# Patient Record
Sex: Female | Born: 1940 | Race: White | Hispanic: No | Smoking: Never smoker
Health system: Southern US, Community
[De-identification: ages and names within clinical notes are randomized; demographics above are authoritative.]

## PROBLEM LIST (undated history)

## (undated) DIAGNOSIS — I69359 Hemiplegia and hemiparesis following cerebral infarction affecting unspecified side: Secondary | ICD-10-CM

## (undated) DIAGNOSIS — F0390 Unspecified dementia without behavioral disturbance: Secondary | ICD-10-CM

## (undated) DIAGNOSIS — I10 Essential (primary) hypertension: Secondary | ICD-10-CM

## (undated) DIAGNOSIS — N289 Disorder of kidney and ureter, unspecified: Secondary | ICD-10-CM

## (undated) DIAGNOSIS — R451 Restlessness and agitation: Secondary | ICD-10-CM

---

## 1998-11-30 ENCOUNTER — Other Ambulatory Visit: Admission: RE | Admit: 1998-11-30 | Discharge: 1998-11-30 | Payer: Self-pay | Admitting: Obstetrics and Gynecology

## 1999-02-12 ENCOUNTER — Encounter: Admission: RE | Admit: 1999-02-12 | Discharge: 1999-05-13 | Payer: Self-pay | Admitting: Otolaryngology

## 1999-12-20 ENCOUNTER — Other Ambulatory Visit: Admission: RE | Admit: 1999-12-20 | Discharge: 1999-12-20 | Payer: Self-pay | Admitting: *Deleted

## 1999-12-25 ENCOUNTER — Ambulatory Visit (HOSPITAL_BASED_OUTPATIENT_CLINIC_OR_DEPARTMENT_OTHER): Admission: RE | Admit: 1999-12-25 | Discharge: 1999-12-25 | Payer: Self-pay | Admitting: Orthopaedic Surgery

## 2000-04-17 ENCOUNTER — Encounter (INDEPENDENT_AMBULATORY_CARE_PROVIDER_SITE_OTHER): Payer: Self-pay | Admitting: Specialist

## 2000-04-17 ENCOUNTER — Inpatient Hospital Stay (HOSPITAL_COMMUNITY): Admission: RE | Admit: 2000-04-17 | Discharge: 2000-04-18 | Payer: Self-pay | Admitting: Obstetrics and Gynecology

## 2000-07-17 ENCOUNTER — Ambulatory Visit (HOSPITAL_COMMUNITY): Admission: RE | Admit: 2000-07-17 | Discharge: 2000-07-17 | Payer: Self-pay | Admitting: Gastroenterology

## 2000-07-17 ENCOUNTER — Encounter (INDEPENDENT_AMBULATORY_CARE_PROVIDER_SITE_OTHER): Payer: Self-pay

## 2000-08-22 ENCOUNTER — Encounter: Payer: Self-pay | Admitting: Obstetrics and Gynecology

## 2000-08-22 ENCOUNTER — Encounter: Admission: RE | Admit: 2000-08-22 | Discharge: 2000-08-22 | Payer: Self-pay | Admitting: Obstetrics and Gynecology

## 2002-05-11 ENCOUNTER — Ambulatory Visit (HOSPITAL_BASED_OUTPATIENT_CLINIC_OR_DEPARTMENT_OTHER): Admission: RE | Admit: 2002-05-11 | Discharge: 2002-05-11 | Payer: Self-pay | Admitting: Orthopaedic Surgery

## 2004-09-27 ENCOUNTER — Emergency Department (HOSPITAL_COMMUNITY): Admission: EM | Admit: 2004-09-27 | Discharge: 2004-09-28 | Payer: Self-pay | Admitting: Emergency Medicine

## 2006-01-31 ENCOUNTER — Emergency Department (HOSPITAL_COMMUNITY): Admission: EM | Admit: 2006-01-31 | Discharge: 2006-01-31 | Payer: Self-pay | Admitting: Emergency Medicine

## 2006-07-08 ENCOUNTER — Ambulatory Visit (HOSPITAL_COMMUNITY): Admission: RE | Admit: 2006-07-08 | Discharge: 2006-07-08 | Payer: Self-pay | Admitting: Gastroenterology

## 2006-07-08 ENCOUNTER — Encounter (INDEPENDENT_AMBULATORY_CARE_PROVIDER_SITE_OTHER): Payer: Self-pay | Admitting: Specialist

## 2006-11-14 ENCOUNTER — Encounter: Admission: RE | Admit: 2006-11-14 | Discharge: 2006-11-14 | Payer: Self-pay | Admitting: Gastroenterology

## 2006-12-08 ENCOUNTER — Encounter: Admission: RE | Admit: 2006-12-08 | Discharge: 2006-12-08 | Payer: Self-pay | Admitting: Gastroenterology

## 2007-12-30 IMAGING — CT CT HEAD W/O CM
1 series · 16 of 30 positions shown, 20 images · IV contrast (agent unspecified)
Comparison: No comparison films available.

CLINICAL DATA: Altered mental status.  Confusion.  Hypertension.
HEAD CT WITHOUT CONTRAST:
TECHNIQUE: Contiguous axial images were obtained from the base of the skull through the vertex according to standard protocol without contrast.

[Series 2: brain · axial · 0.47mm/px · z∈[+145,+290]mm · 16 of 30 slices shown, 20 images]
[im 2/30  brain]
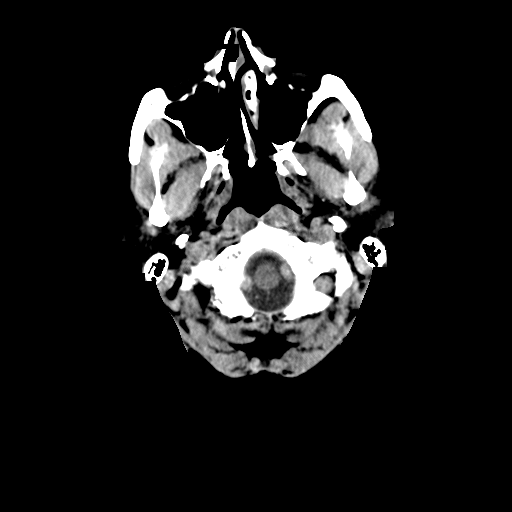
[im 2/30  bone]
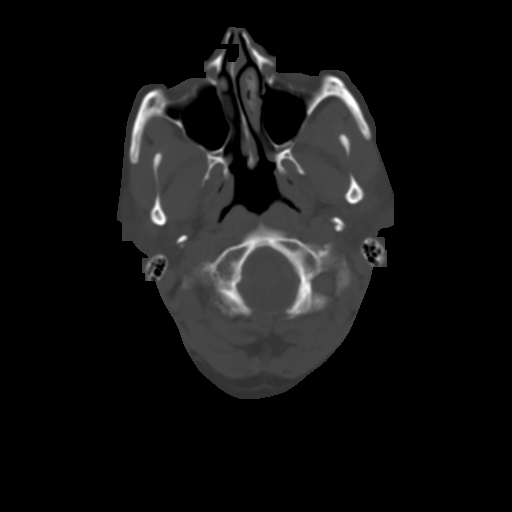
[im 4/30  brain]
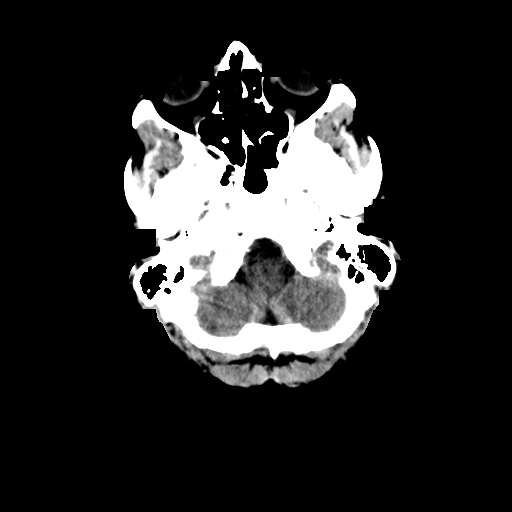
[im 6/30  brain]
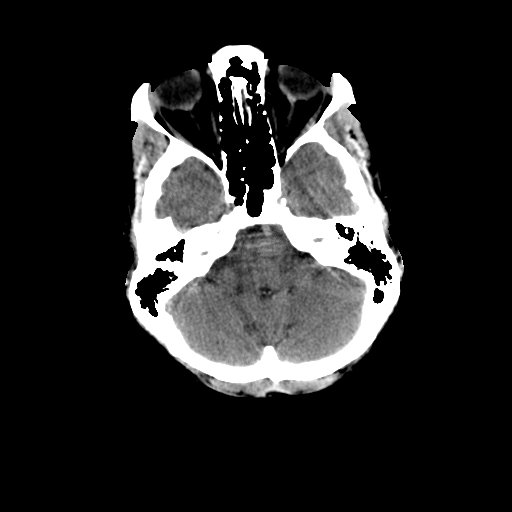
[im 8/30  brain]
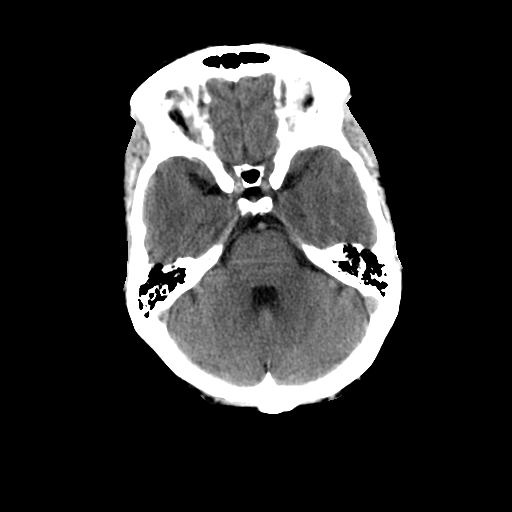
[im 9/30  brain]
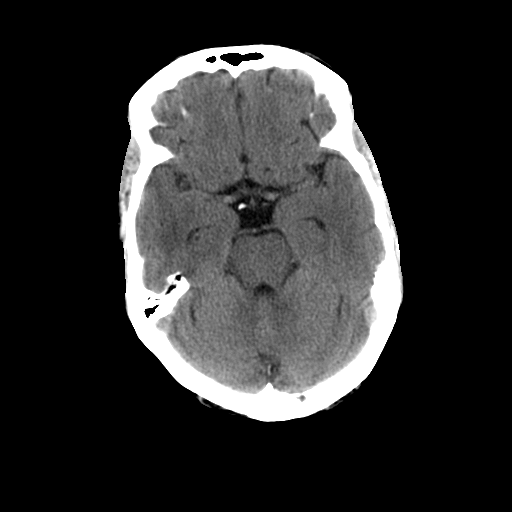
[im 9/30  bone]
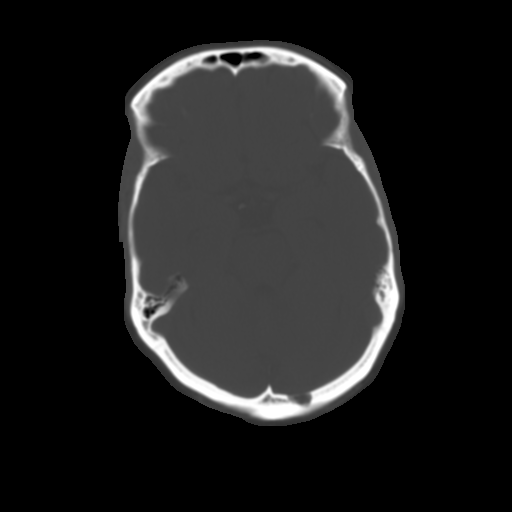
[im 11/30  brain]
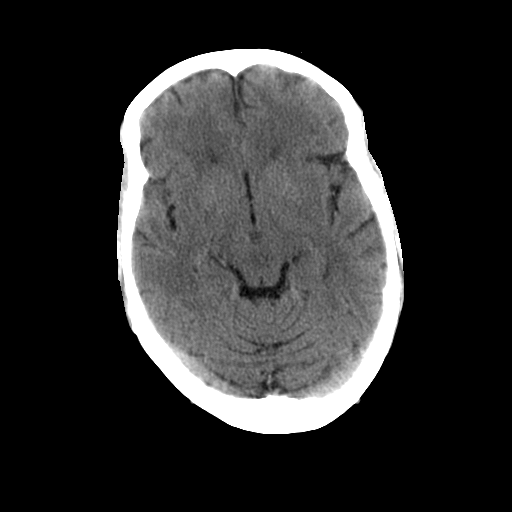
[im 13/30  brain]
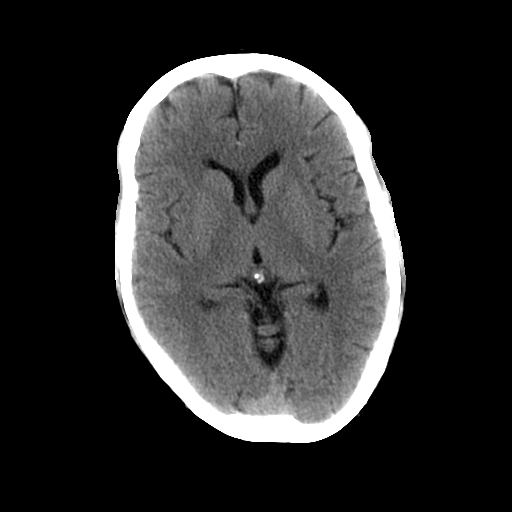
[im 15/30  brain]
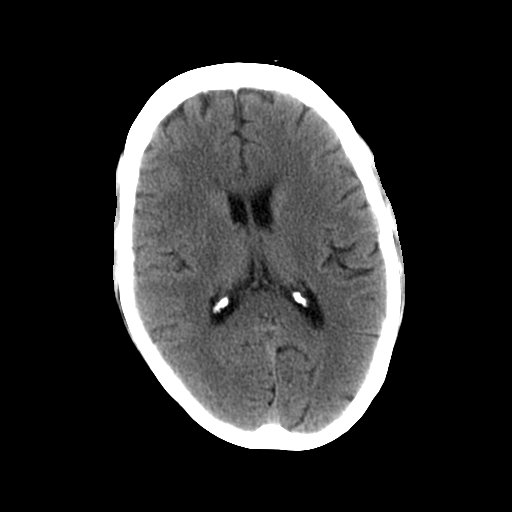
[im 16/30  brain]
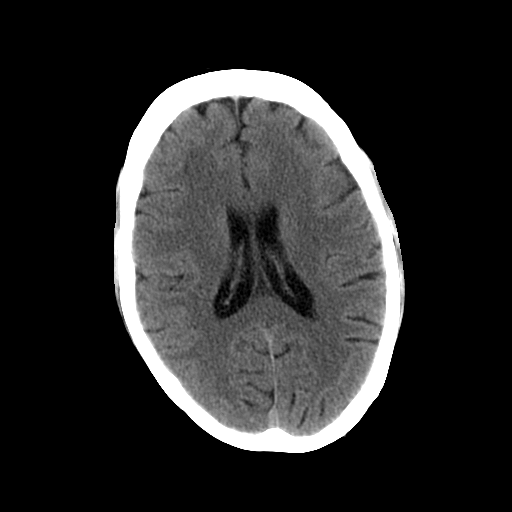
[im 16/30  bone]
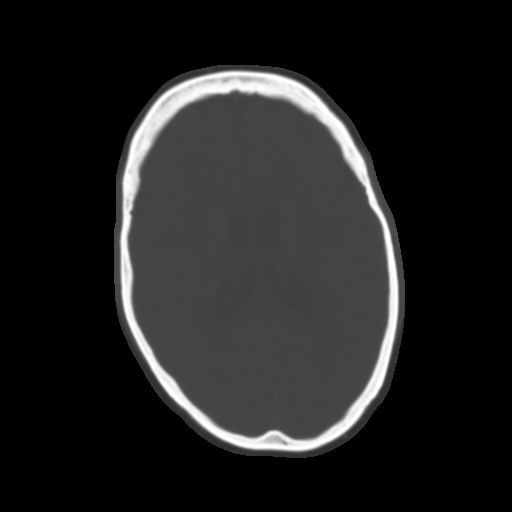
[im 18/30  brain]
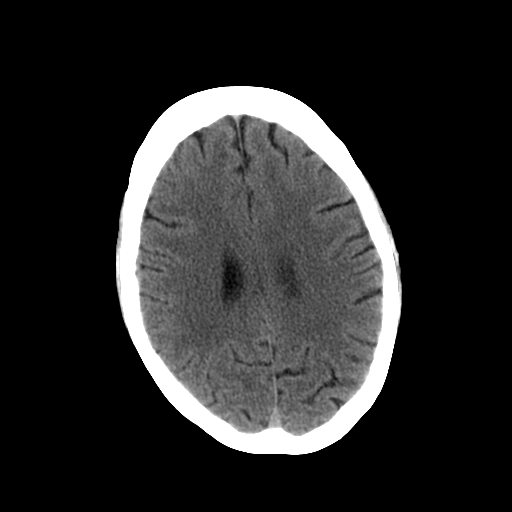
[im 20/30  brain]
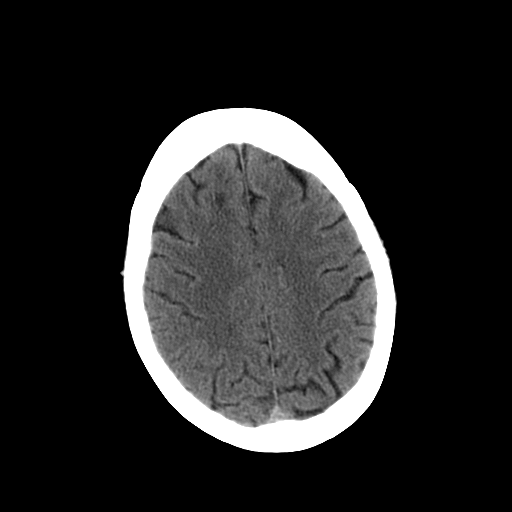
[im 22/30  brain]
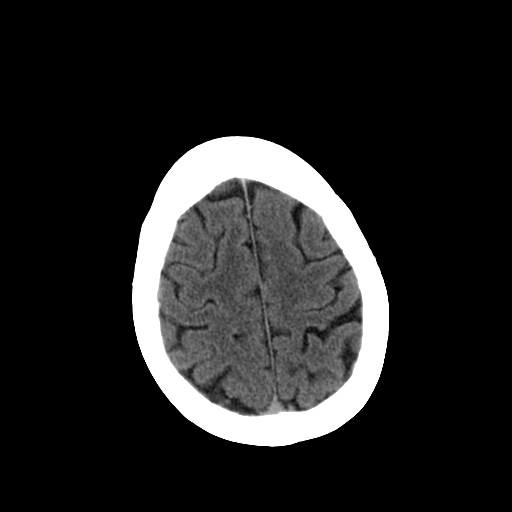
[im 23/30  brain]
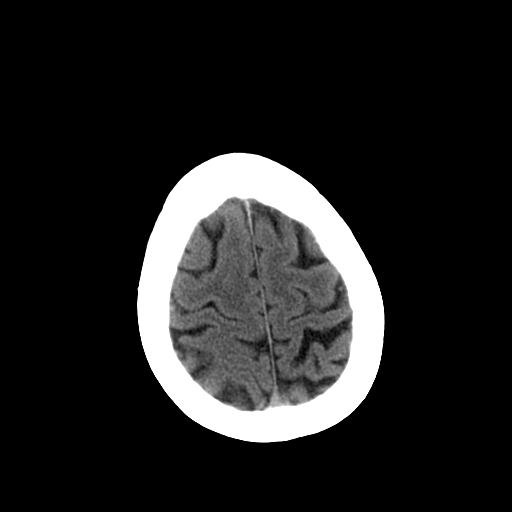
[im 23/30  bone]
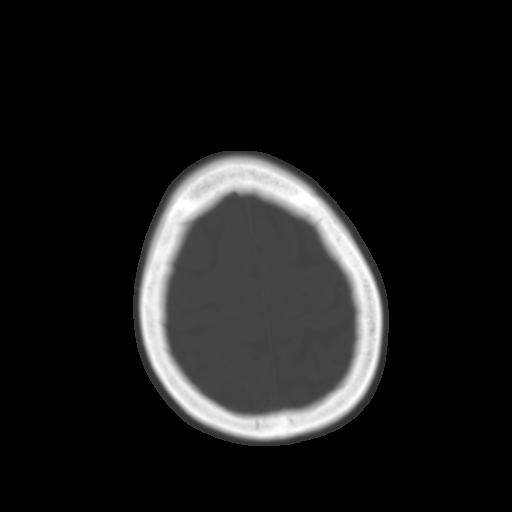
[im 25/30  brain]
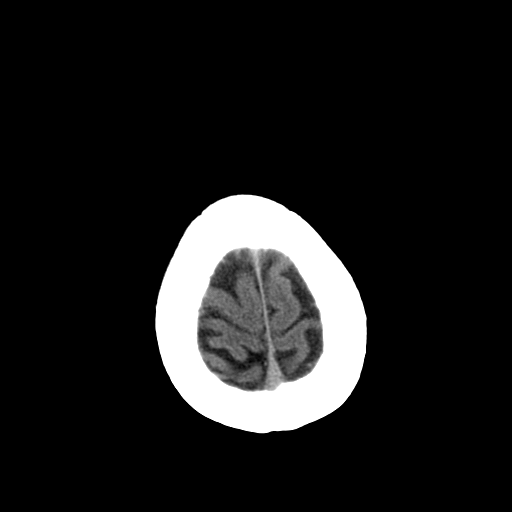
[im 27/30  brain]
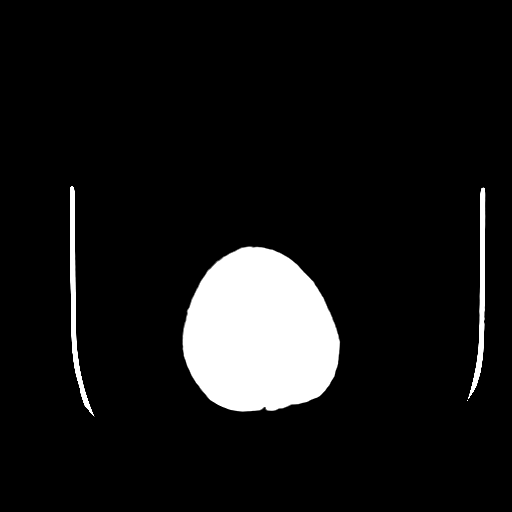
[im 29/30  brain]
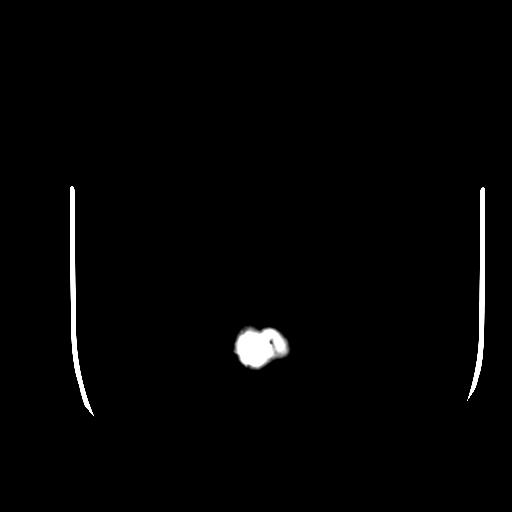

[16 of 30 positions shown; findings below may reference images not displayed]

FINDINGS: There is no evidence of acute intracranial abnormality including mass or mass effect, hydrocephalus, extraaxial fluid collection, midline shift, hemorrhage, or infarct.  Acute infarct may be missed by CT for 24 to 48 hours.  Nonspecific white matter hypodensities within the right frontal lobe identified.  The visualized bony calvarium and paranasal sinuses are unremarkable.
IMPRESSION: 1.  No evidence of acute intracranial abnormality.
2.  Nonspecific white matter disease - consider MR follow up.

## 2008-07-06 ENCOUNTER — Emergency Department (HOSPITAL_COMMUNITY): Admission: EM | Admit: 2008-07-06 | Discharge: 2008-07-06 | Payer: Self-pay | Admitting: Emergency Medicine

## 2008-09-25 ENCOUNTER — Inpatient Hospital Stay (HOSPITAL_COMMUNITY): Admission: EM | Admit: 2008-09-25 | Discharge: 2008-09-26 | Payer: Self-pay | Admitting: Emergency Medicine

## 2009-03-08 ENCOUNTER — Encounter: Admission: RE | Admit: 2009-03-08 | Discharge: 2009-06-06 | Payer: Self-pay | Admitting: Neurology

## 2009-05-04 ENCOUNTER — Emergency Department (HOSPITAL_COMMUNITY): Admission: EM | Admit: 2009-05-04 | Discharge: 2009-05-05 | Payer: Self-pay | Admitting: Emergency Medicine

## 2009-05-06 ENCOUNTER — Emergency Department (HOSPITAL_COMMUNITY): Admission: EM | Admit: 2009-05-06 | Discharge: 2009-05-06 | Payer: Self-pay | Admitting: Family Medicine

## 2009-07-06 ENCOUNTER — Inpatient Hospital Stay (HOSPITAL_COMMUNITY): Admission: EM | Admit: 2009-07-06 | Discharge: 2009-07-10 | Payer: Self-pay | Admitting: Emergency Medicine

## 2010-08-06 ENCOUNTER — Emergency Department (HOSPITAL_COMMUNITY): Admission: EM | Admit: 2010-08-06 | Discharge: 2010-08-06 | Payer: Self-pay | Admitting: Emergency Medicine

## 2010-08-25 IMAGING — CR DG ABDOMEN ACUTE W/ 1V CHEST
3 series · 3 of 3 positions shown · non-contrast
Comparison: 09/25/2008

CLINICAL DATA: Pain

ACUTE ABDOMEN SERIES (ABDOMEN 2 VIEW & CHEST 1 VIEW)

[w chest pa]
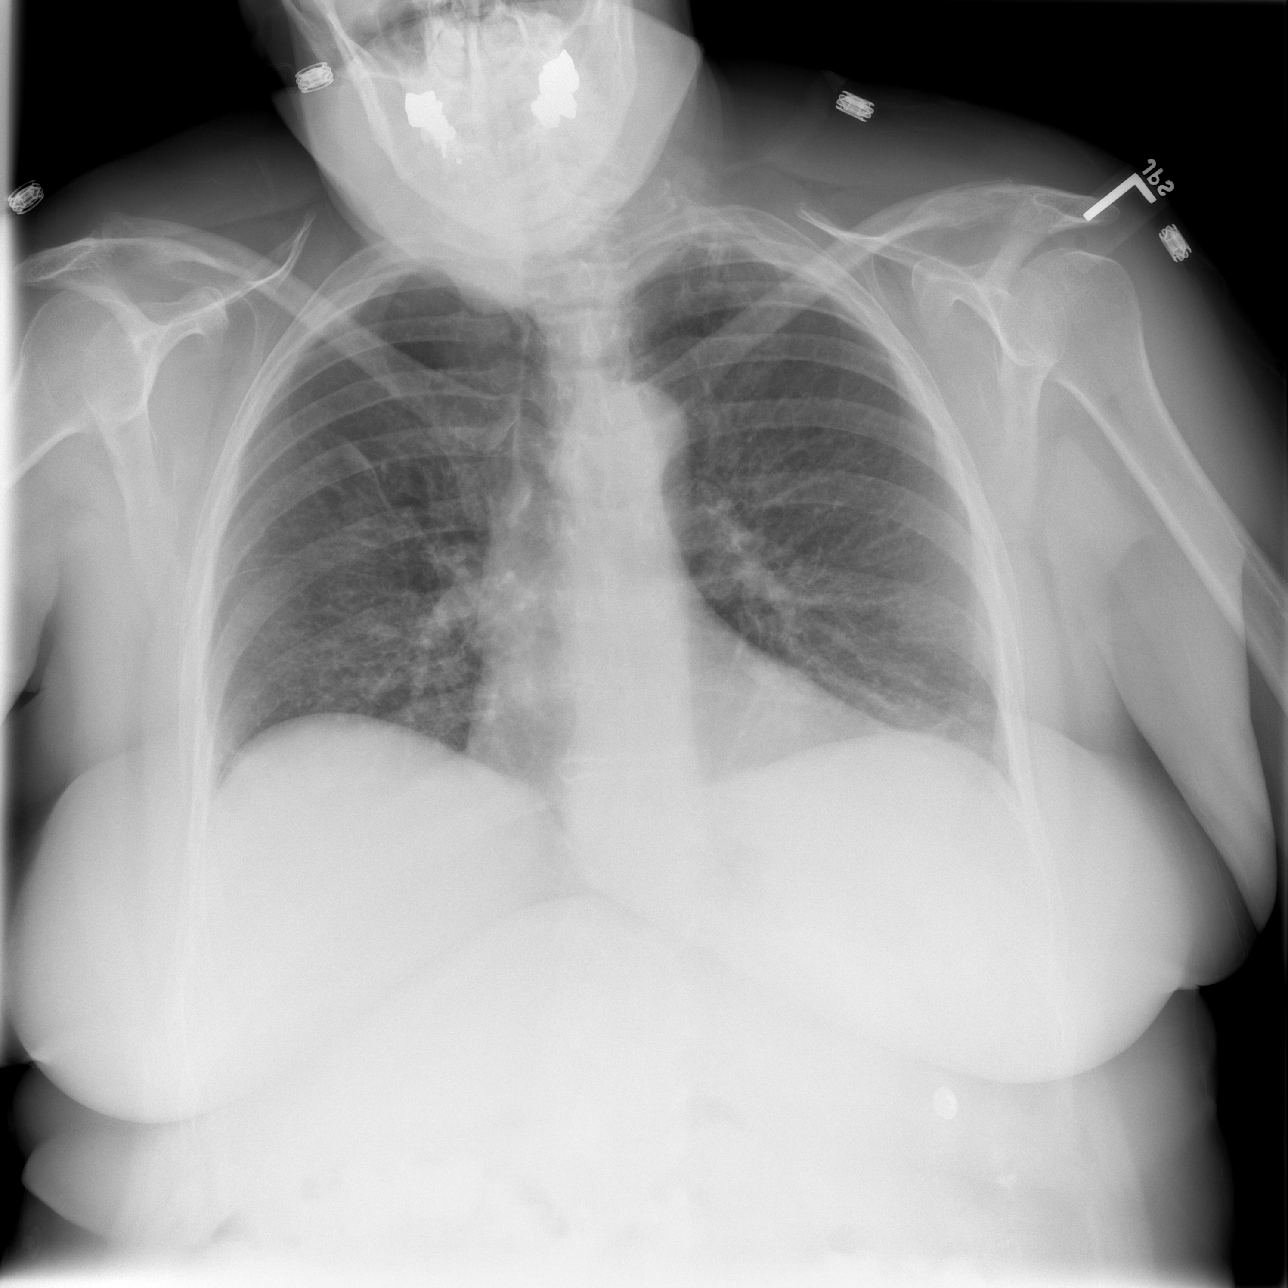

[w abdomen upright]
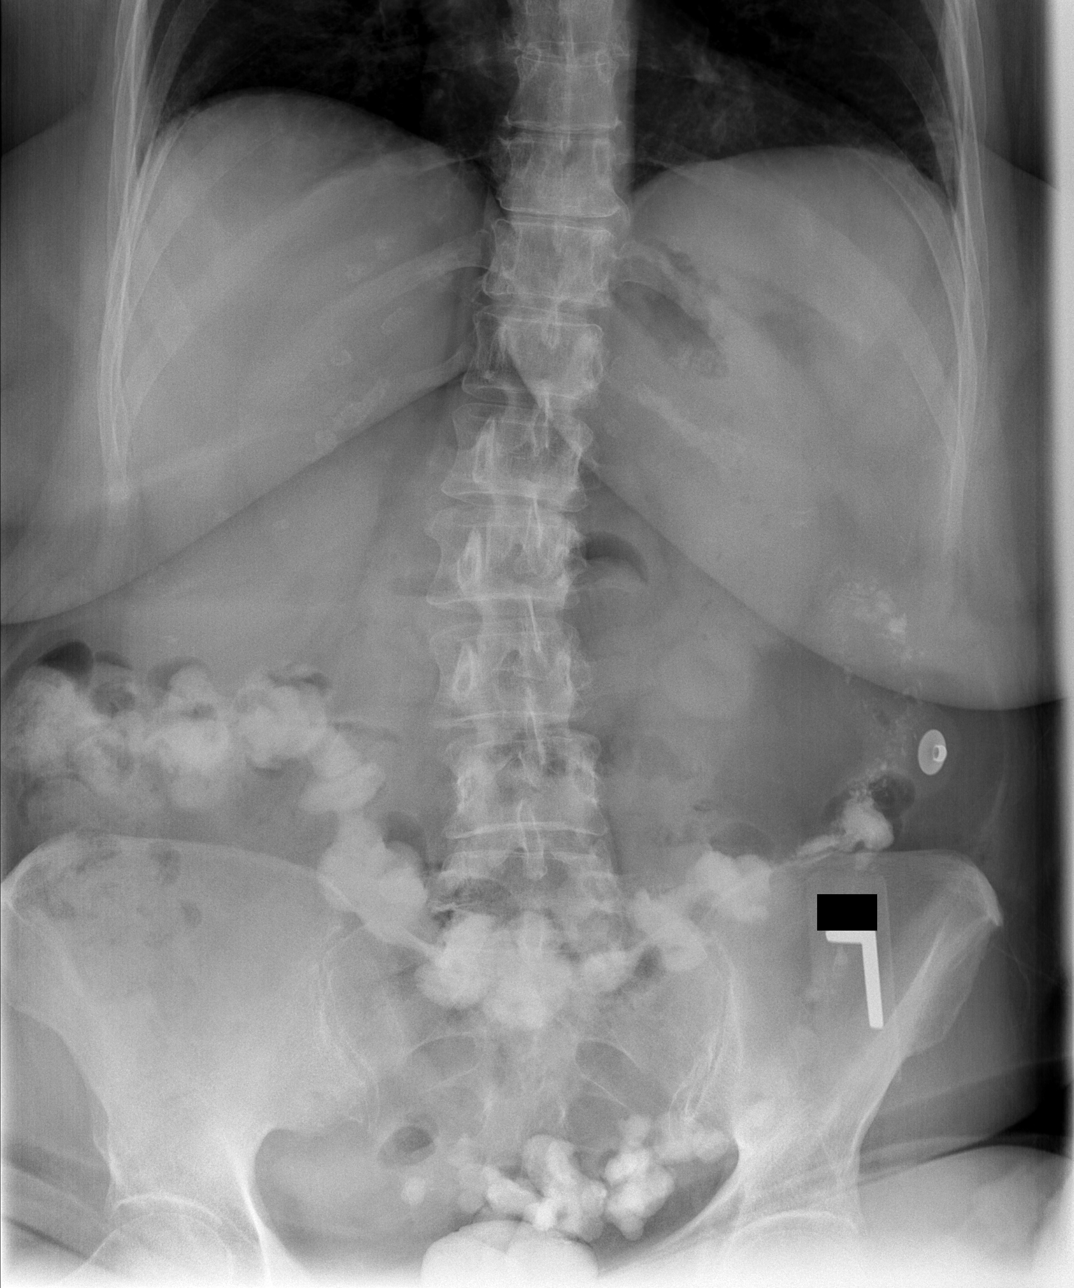

[t abdomen supine]
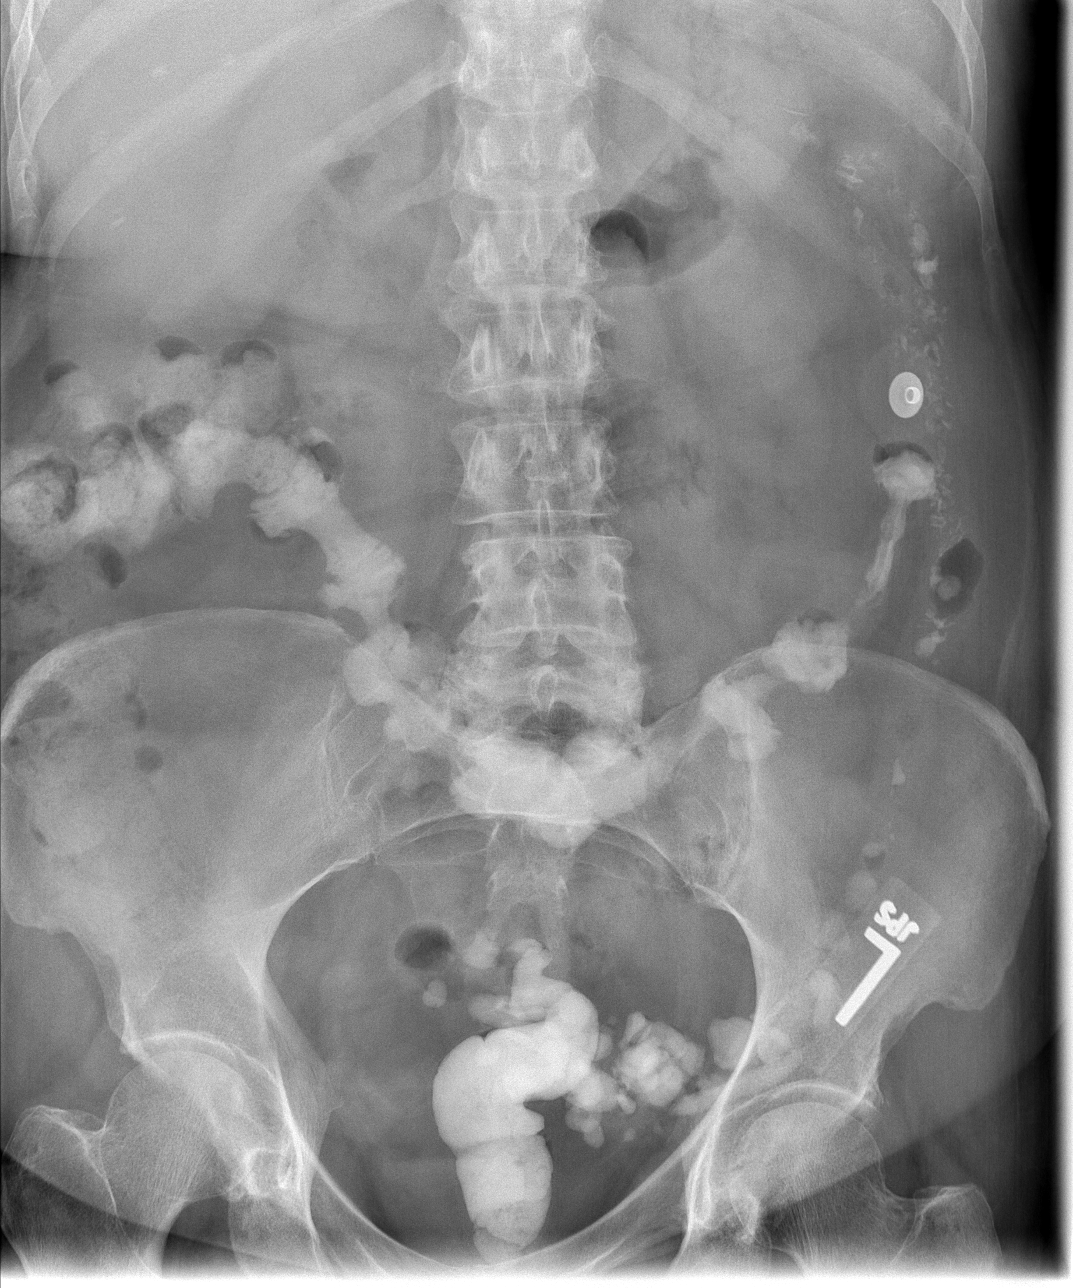

[3 of 3 positions shown; findings below may reference images not displayed]

FINDINGS: Lungs are under aerated with bibasilar atelectasis.
Heart is prominent due to low lung volumes.

There is no free intraperitoneal gas.  No disproportionally dilated
loops of bowel. Nonspecific air-fluid levels are present.  Contrast
throughout the colon from recent CT scan is noted.
IMPRESSION: Nonobstructive bowel gas pattern.

Bibasilar atelectasis.

## 2011-02-23 LAB — BASIC METABOLIC PANEL
BUN: 11 mg/dL (ref 6–23)
CO2: 28 mEq/L (ref 19–32)
Chloride: 101 mEq/L (ref 96–112)
GFR calc non Af Amer: >60 mL/min (ref >60)
Glucose, Bld: 104 mg/dL — ABNORMAL HIGH (ref 70–99)
Glucose, Bld: 115 mg/dL — ABNORMAL HIGH (ref 70–99)
Potassium: 3.5 mEq/L (ref 3.5–5.1)
Potassium: 3.6 mEq/L (ref 3.5–5.1)
Sodium: 144 mEq/L (ref 135–145)

## 2011-02-23 LAB — URINALYSIS, ROUTINE W REFLEX MICROSCOPIC
Nitrite: NEGATIVE
Protein, ur: NEGATIVE mg/dL
Specific Gravity, Urine: 1.03 (ref 1.005–1.030)
Urobilinogen, UA: 1 mg/dL (ref 0.0–1.0)

## 2011-02-23 LAB — DIFFERENTIAL
Basophils Relative: 1 % (ref 0–1)
Eosinophils Absolute: 0 10*3/uL (ref 0.0–0.7)
Eosinophils Relative: 0 % (ref 0–5)
Lymphs Abs: 1.5 10*3/uL (ref 0.7–4.0)
Monocytes Relative: 6 % (ref 3–12)

## 2011-02-23 LAB — CARDIAC PANEL(CRET KIN+CKTOT+MB+TROPI)
CK, MB: 1.5 ng/mL (ref 0.3–4.0)
CK, MB: 1.5 ng/mL (ref 0.3–4.0)
CK, MB: 1.8 ng/mL (ref 0.3–4.0)
Total CK: 60 U/L (ref 7–177)
Troponin I: 0.01 ng/mL (ref 0.00–0.06)
Troponin I: 0.01 ng/mL (ref 0.00–0.06)

## 2011-02-23 LAB — CBC
HCT: 38 % (ref 36.0–46.0)
HCT: 42.1 % (ref 36.0–46.0)
Hemoglobin: 13.1 g/dL (ref 12.0–15.0)
Hemoglobin: 13.6 g/dL (ref 12.0–15.0)
MCHC: 33.6 g/dL (ref 30.0–36.0)
MCHC: 34.3 g/dL (ref 30.0–36.0)
MCV: 90.1 fL (ref 78.0–100.0)
MCV: 90.5 fL (ref 78.0–100.0)
Platelets: 238 10*3/uL (ref 150–400)
RBC: 4.4 MIL/uL (ref 3.87–5.11)
RDW: 13.9 % (ref 11.5–15.5)
WBC: 11.6 10*3/uL — ABNORMAL HIGH (ref 4.0–10.5)

## 2011-02-23 LAB — LIPID PANEL
Cholesterol: 108 mg/dL (ref 0–200)
HDL: 30 mg/dL — ABNORMAL LOW (ref >39)
LDL Cholesterol: 60 mg/dL (ref 0–99)
Total CHOL/HDL Ratio: 3.6 RATIO
VLDL: 18 mg/dL (ref 0–40)

## 2011-02-23 LAB — COMPREHENSIVE METABOLIC PANEL
BUN: 11 mg/dL (ref 6–23)
CO2: 25 mEq/L (ref 19–32)
Calcium: 8.8 mg/dL (ref 8.4–10.5)
Creatinine, Ser: 0.89 mg/dL (ref 0.4–1.2)
GFR calc non Af Amer: >60 mL/min (ref >60)
Glucose, Bld: 136 mg/dL — ABNORMAL HIGH (ref 70–99)

## 2011-02-23 LAB — HEMOGLOBIN A1C
Hgb A1c MFr Bld: 6.8 % — ABNORMAL HIGH (ref 4.6–6.1)
Mean Plasma Glucose: 148 mg/dL

## 2011-02-23 LAB — AMMONIA: Ammonia: 28 umol/L (ref 11–35)

## 2011-02-23 LAB — URINE CULTURE

## 2011-02-23 LAB — CK TOTAL AND CKMB (NOT AT ARMC): Relative Index: INVALID (ref 0.0–2.5)

## 2011-02-23 LAB — PROTIME-INR: Prothrombin Time: 14.2 seconds (ref 11.6–15.2)

## 2011-04-02 NOTE — Discharge Summary (Signed)
NAMECHAQUETTA, SCHLOTTMAN              ACCOUNT NO.:  [id number]   MEDICAL RECORD NO.:  [id number]          PATIENT TYPE:  INP   LOCATION:  1409                         FACILITY:  [facility]   PHYSICIAN:  Hollice Espy, M.D.DATE OF BIRTH:  10/16/1941   DATE OF ADMISSION:  07/06/2009  DATE OF DISCHARGE:  07/10/2009                               DISCHARGE SUMMARY   ATTENDING PHYSICIAN:  Hollice Espy, M.D.   PRIMARY CARE PHYSICIAN:  Dr. Catha Gosselin   CONSULTANT:  Dr. Delia Heady, Neurology.   DISCHARGE DIAGNOSES:  1. Acute episode of gait abnormality and dizziness, now resolved.  2. History of cortical basal ganglia degeneration.  3. Dementia.  4. Constipation.  5. History of hypertension.  6. History of hyperlipidemia.  7. History of gastroesophageal reflux disease.   DISCHARGE MEDICATIONS:  The patient will resume all of her previous  medications.  1. She takes 2 fish oil tablets in the morning and 1 in the evening.  2. Multivitamin p.o. daily.  3. Os-Cal 500 mg 1 p.o. b.i.d.  4. Glucosamine and chondroitin 3 tablets p.o. daily.  5. Vitamin C 1 tablet p.o. daily.  6. Aspirin 81 p.o. daily.  7. Mucinex 600 mg p.o. b.i.d.  8. Hydrochlorothiazide 25 mg p.o. daily.  9. Seroquel 100 mg p.o. q.h.s.  10.Zocor 80 mg p.o. daily.  11.Lexapro 50 mg p.o. daily.  12.Aricept 10 p.o. daily.   DISCHARGE DIET:  Low-sodium diet.   DISPOSITION:  Improved.   ACTIVITY:  Slow to increased walk with assistance and 24-hour  supervision.   HOSPITAL COURSE:  The patient is a 70 year old white female with past  medical history of cortical basal ganglia degeneration and dementia who  is normal baseline.  She walks at baseline with a walker and lives at  home with 24-hour care.  One day prior to coming in, she started having  trouble walking, having severe gait abnormality and some dizziness.  When she presented to the emergency room a CT scan of the head done was  unremarkable.  She came  in for a stroke workup.  Neurology was  consulted.  She had MRI, MRA, Dopplers all of which were completely  unremarkable.  It was difficult to assess her given her mild dementia as  well as her cortical basal ganglia degeneration.  The patient will  follow up with the neurologist in [facility].  The patient received  supportive care including Antivert, medicines for nausea and PT, OT  evaluation.  She also received some gentle fluids for hydration.  This  seemed to help and by 07/09/2009, the patient's symptoms started to  abate.  By 07/10/2009, she was feeling back to her baseline.  She was  able to assist and PT, OT recommended that she was able to go home as  long as the continued 24-hour assistance was there, which the patient  did indeed have.  Neurology felt that the patient was stable.  They felt  that she was back to her baseline and they signed off at this time.  It  was unclear as far as what the underlying  event was, and again it is  difficult to assess the patient's history with her dementia and cortical  basal ganglia degeneration.  They recommend that she follow up with Dr.  Celene Kras in [facility] in the next 1-2 weeks.   The patient on medical issues were stable during this hospitalization.  She had some episodes of mild constipation and following laxatives, this  much improved.  She will be discharged on all of her normal home  medications as above.  She will follow up with her PCP, Dr. Catha Gosselin, in the next 2-4 weeks.      Hollice Espy, M.D.  Electronically Signed     SKK/MEDQ  D:  07/10/2009  T:  07/10/2009  Job:  [id number]   cc:   Caryn Bee L. Little, M.D.  Fax: [phone]

## 2011-04-02 NOTE — H&P (Signed)
Sally Zimmerman, Sally Zimmerman              ACCOUNT NO.:  [id number]   MEDICAL RECORD NO.:  [id number]          PATIENT TYPE:  INP   LOCATION:  4729                         FACILITY:  [facility]   PHYSICIAN:  Kela Millin, M.D.DATE OF BIRTH:  Oct 21, 1941   DATE OF ADMISSION:  09/24/2008  DATE OF DISCHARGE:                              HISTORY & PHYSICAL   PRIMARY CARE PHYSICIAN:  Dr. Catha Gosselin.   CHIEF COMPLAINT:  Abdominal pain.   HISTORY OF PRESENT ILLNESS:  The patient is a 70 year old white female  with past medical history significant for atypical reflux symptoms and  status post EGD with biopsy in August 2007.      Kela Millin, M.D.     ACV/MEDQ  D:  09/25/2008  T:  09/25/2008  Job:  [id number]

## 2011-04-02 NOTE — H&P (Signed)
Sally Zimmerman, Sally Zimmerman              ACCOUNT NO.:  [id number]   MEDICAL RECORD NO.:  [id number]          PATIENT TYPE:  INP   LOCATION:  0105                         FACILITY:  [facility]   PHYSICIAN:  Michiel Cowboy, MDDATE OF BIRTH:  1941-05-02   DATE OF ADMISSION:  07/06/2009  DATE OF DISCHARGE:                              HISTORY & PHYSICAL   PRIMARY CARE PHYSICIAN:  Dr. Catha Gosselin.   CHIEF COMPLAINT:  Weakness.   The patient is a 70 year old female with history of cortical basilar  degeneration and dementia.  The patient lives at home with care provide.  For the past few days starting from Wednesday, she developed progressive  generalized weakness, somewhat worse in her lower extremities than her  upper extremities.  She also endorses that she has a hard time with  sensations secondary to dementia.  It is really hard to determine if  this is acute or has been progressive.  The weakness is definitely  different.  Prior to Wednesday, she was able to ambulate.  Starting from  Wednesday, she had a hard time getting out of bed, and now needs  assistance with transfers as well as turning in bed.  She also has been  having progressive dysphagia and episodes of coughing after eating.  She  reports that she see Dr. Judeen Hammans at [facility], who is her neurologist.  She presented with above-mentioned complaints to her primary care  Mac Dowdell, who at first thought that she may have a urinary tract  infection but that turned out to be negative, at which point, they  recommended for her to come to the emergency department.  In ED, the  patient did get MRI of her spine, which did not show any evidence of  cord compression, and also CT scan of her head, which was unremarkable.  Chest x-ray was also unremarkable as well as a UA.  CK is normal.  The  patient does have dementia and has a hard time giving me history.  She  seems slightly inappropriate at times.   REVIEW OF SYSTEMS:  The patient  does endorse low grade fever a few days  ago.  Also remarkable for abdominal pain, which appears to be a  recurrence and had been worked up in the past.   PAST MEDICAL HISTORY:  1. Significant for cortical basilar degeneration dementia.  2. Hypertension.  3. Hyperlipidemia.  4. GERD.   FAMILY HISTORY:  Significant for father with asbestos lung disease.   SOCIAL HISTORY:  The patient continues to smoke.  Does not drink, does  not abuse drugs.   ALLERGIES:  None.   MEDICATIONS:  Doses unknown, but per her caregiver, she takes:  1. Fish oil twice a day.  2. Seroquel at night.  Prior dose of this was 100 mg daily.  3. Sertraline dose unknown daily.  4. Simvastatin, dose unknown daily.  5. Aricept likely 10 mg daily.  6. Mucinex twice a day.  7. Colchicine at bedtime.  8. Vitamin and calcium.  9. Glucosamine.  10.Baby aspirin.  11.Hydrochlorothiazide, dose unclear.   PHYSICAL EXAMINATION:  VITALS:  Temperature 98.1, blood pressure 86/69  initially but now up to 110/76, pulse 70, respirations 16 satting 94% on  room air.  The patient appears to be in no acute distress.  HEAD:  Nontraumatic.  Moist mucous membranes.  Somewhat dry mucous  membranes.  The patient just ate a sandwich and now has a dry cough but no  respiratory distress noted.  HEART: Regular rate and rhythm.  No murmurs, rubs or gallops.  ABDOMEN: Soft and nontender, nondistended.  LOWER EXTREMITIES: No clubbing, cyanosis or edema.  LUNGS:  Clear to auscultation bilaterally.  NEUROLOGIC:  There is a slight vertical nystagmus noted, otherwise  cranial nerves intact.  Strength 5/5 in upper extremities, acutely  somewhat diminished bilaterally in the lower re-examine.  Equal lateral  Babinski's bilaterally.  She appeared to have normal sensation in her  lower extremities as well as upper extremities but reports decreased  sensation around her buttocks area.   LABS:  White blood cell count 11.6, hemoglobin 14.2.   Sodium 138,  potassium 3.5, chloride 0.9.  Cardiac enzymes negative.  UA negative.   Chest x-ray showing no acute cardiopulmonary disease.   CT scan of head unremarkable for any acute changes.  Chronic small  vessel disease noted.   EKG showing normal sinus rhythm, heart rate 69, no ischemic changes  noted.   MRI of the spine showing mild spondylosis .   ASSESSMENT/PLAN:  This is a 70 year old female with past medical history  significant for dementia and cortical basilar degeneration here with  progressive weakness of unclear etiology.  1. Weakness:  This appears to be more general in nature, although the      patient is a poor historian and not cooperating fully with physical      exam, which makes it difficult to determine.  She is not localizing      to one side or the other.  The MRI of her back was unremarkable,      which is encouraging.  Would strongly recommend neurology consult      to see if this has anything to do with progression of her disease.      Review of up-to-date articles, it is somewhat less likely, as this      is a more progressive illness at the sudden onset changes.  2. The patient may be slightly dehydrated.  We will give IV fluids.      She is apparently having troubles with      dysphasia and possible aspiration.  Will make n.p.o. and do      swallow.  Hold p.o. meds until then.  3. Given risk factors, will cycle cardiac markers, but I doubt that to      be positive.  4. Prophylaxis:  Protonix and Lovenox.  SCDs.      Michiel Cowboy, MD  Electronically Signed     AVD/MEDQ  D:  07/06/2009  T:  07/07/2009  Job:  [id number]   cc:   Caryn Bee L. Little, M.D.  Fax: [phone]

## 2011-04-02 NOTE — H&P (Signed)
NAMEARLINE, KETTER              ACCOUNT NO.:  [id number]   MEDICAL RECORD NO.:  [id number]          PATIENT TYPE:  INP   LOCATION:  4729                         FACILITY:  [facility]   PHYSICIAN:  Kela Millin, M.D.DATE OF BIRTH:  1941/05/06   DATE OF ADMISSION:  09/24/2008  DATE OF DISCHARGE:                              HISTORY & PHYSICAL   PRIMARY CARE PHYSICIAN:  Dr. Catha Gosselin.   CHIEF COMPLAINT:  Abdominal pain.   HISTORY OF PRESENT ILLNESS:  The patient is a 70 year old white female  with past medical history significant for atypical reflux symptoms and  status post EGD with biopsy in August 2007 revealing antral  erosions/minimal bulbitis per Dr. Ewing Schlein and otherwise normal,  hypertension, hyperlipidemia, depression who presents with the above  complaints.  She is a poor historian and initially per the ER physician  reported that she was having vague pain from her chest all the way down  to her lower abdomen.  She subsequently indicated that the pain was more  in her left lower quadrant.  She is unable to describe the pain further.  She denies nausea or vomiting, diarrhea, shortness of breath, melena,  hematemesis, or no hematochezia.   She was seen in the ER and point of care markers were done which were  negative x2.  A chest x-ray showed no active disease, chronic appearing  interstitial markings noted.  A CT scan was then done which showed a  prominent loop of small bowel in the left pelvis and lower abdomen, ?  mucosal edema per radiologist and nonspecific gastroenteritis versus  inflammatory bowel disease or possibly infiltrative process.  Rectosigmoid colonic diverticula noted but no diverticulitis.  A  urinalysis showed small leukocyte esterase, 3-6 wbc's,  rare bacteria  and the patient was empirically started on antibiotics and given  sublingual nitroglycerin.  It is noted from review of the ED records  that the patient had initially indicated that she was  having chest pain  which seemed to be worse with movement or exertion.  At the time of my  interview she denies any chest pain and states that she had been having  abdominal pain which is now improved.   PAST MEDICAL HISTORY:  1. As above.  2. Question dementia.   MEDICATIONS:  1. Lipitor 80 mg p.o. daily.  2. Aricept 10 mg p.o. daily.  3. Seroquel 100 mg p.o. daily.  4. Aspirin 81 mg p.o. daily.  5. Lexapro 10 mg p.o. daily.   ALLERGIES:  NKDA.   SOCIAL HISTORY:  She denies tobacco.  She also denies alcohol.   FAMILY HISTORY:  Her father had asbestos lung disease.   REVIEW OF SYSTEMS:  As per HPI, otherwise unable to obtain.   PHYSICAL EXAMINATION:  VITAL SIGNS:  Temperature is 98, max of 100.8 in  the ED, blood pressure 116/81, initially was 199/84, pulse is 81,  respiratory rate 18, oxygen saturation 99%.  HEENT:  PERRL, EOMI.  Slightly dry mucous membranes and no oral  exudates.  NECK:  Supple, no adenopathy, no thyromegaly, and no JVD.  LUNGS:  Decreased  breath sounds at bases, otherwise clear to  auscultation.  CARDIOVASCULAR:  Regular rate and rhythm.  Normal S1 and S2.  ABDOMEN:  Soft, normal bowel sounds present, mild left lower quadrant  tenderness, no rebound tenderness.  Nondistended and no organomegaly and  no masses palpable.  EXTREMITIES:  No cyanosis and no edema.  NEURO:  She is somnolent and arouses to voice, but drifts back to sleep  in the middle of the interview.  She follows simple commands.  Her  strength is 4-5/5 and symmetric.  Nonfocal exam.   LABORATORY DATA:  As per HPI.  Also her white cell count is 12.5,  hemoglobin is 15.3, hematocrit 46.2, platelet count of 239,000,  neutrophil count of 79%.  Sodium is 140, potassium 4.1, chloride 101,  BUN 12, creatinine 1.2, glucose 91, ionized calcium is 1.30.   ASSESSMENT AND PLAN:  1. Abdominal pain.  As discussed above.  Mostly left lower quadrant      and CT scan with somewhat prominent loops of  small bowel in the      left pelvis and lower abdomen with question of a mucosal edema.      The patient denies any diarrhea, nausea and vomiting as stated      above.  Her urinalysis reveals mild pyuria.  I will start the      patient on empiric antibiotics and also place her on a PPI.  Follow      and consider GI consultation pending her clinical course.  Seen by      Dr. Ewing Schlein in the past for gastroenterology.  2. Question chest pain.  As discussed above.  Will obtain serial      cardiac enzymes, place on aspirin p.r.n. nitroglycerin.  Follow      cardiac enzymes and further manage as appropriate pending results.  3. Probable urinary tract infection.  Urinalysis as above.  Obtain      cultures and empiric antibiotics as above.  As noted above, the      patient was febrile with a blood pressure of 100.8 while in the ED.  4. Question dementia.  Continue Aricept.  5. History of depression.  Continue outpatient medications.  6. History of hyperlipidemia.  Continue Lipitor.  7. History of hypertension.  No blood pressure medications on her      medication list, but as noted above, her blood pressures were      initially elevated, questionably secondary to pain but improved on      recheck without any anti hypertensive medications administered.      Kela Millin, M.D.  Electronically Signed     ACV/MEDQ  D:  09/25/2008  T:  09/25/2008  Job:  [id number]   cc:   Caryn Bee L. Little, M.D.  Petra Kuba, M.D.

## 2011-04-02 NOTE — Consult Note (Signed)
Sally Zimmerman, Sally Zimmerman              ACCOUNT NO.:  [id number]   MEDICAL RECORD NO.:  [id number]          PATIENT TYPE:  INP   LOCATION:  1409                         FACILITY:  [facility]   PHYSICIAN:  Pramod P. Pearlean Brownie, MD    DATE OF BIRTH:  1941/02/19   DATE OF CONSULTATION:  07/07/2009  DATE OF DISCHARGE:                                 CONSULTATION   REASON FOR REFERRAL:  Gait difficulties in a patient with dementia.   HISTORY OF PRESENT ILLNESS:  Sally Zimmerman is a 67-year lady with known  history of cortical basal ganglia degeneration and has seen a  neurologist in [facility] for the same.  She apparently walks at  baseline with a walker and lives at home with 24-hour care.  Since last  Wednesday, she had trouble walking and is unable to walk, even with  help. She has apparently had no weakness, falls, back injury, or pain.  She is a poor historian given her advanced dementia and no family or  caregivers are available to give history at this time.  She tells me  that she has had trouble walking since Wednesday and the legs are not  working right and she is feeling weak. She denies any pain in the back,  shooting down the legs.  She denies any numbness or tingling in her  feet.  She denies any focal weakness on one side of the body.  Past  neurological history is significant for dementia with cortical basal  ganglia degeneration.  No stroke, TIA, seizures.   PAST MEDICAL HISTORY:  Also includes hypertension, hyperlipidemia,  gastroesophageal reflux disease.   HOME MEDICATIONS:  Seroquel, Zoloft, Aricept, Zocor, colchicine,  glucosamine, aspirin, and hydrochlorothiazide.   REVIEW OF SYSTEMS:  Not available as the patient is unable to provide.   PHYSICAL EXAMINATION:  GENERAL:  A pleasant middle-aged Caucasian lady  who is currently not in distress.  VITAL SIGNS:  She is afebrile, pulse rate is 73 per minute with regular  sinus, blood pressure 103/69, respiratory 20, temperature 98.3.  HEENT:  Head is nontraumatic.  NECK:  Supple.  There is no bruit.  CARDIAC:  Regular heart sounds.  LUNGS:  Clear to auscultation.  NEUROLOGIC:  She is pleasant, awake, alert, cooperative.  On the mini-  mental status exam she scored 11 out of 30 with deficits in practically  every area.  She is unable to __________and name animals. She has a  slight nonfluent speech and has word hesitancy. She follows simple  midline and one-step commands only.  Eye movements are full range.  There is no nystagmus.  There is no decreased facial sensation.  __________is positive. __________is positive on the right.  She has mild  right grasp reflex. She has mild peritonea, right more than left in the  upper and lower extremities.  Tone is increased in both lower  extremities with brisk reflexes at knees and ankles as well as elbows.  Plantar's, both with withdrawal response.  She is able to get up with  one person assist and walks with a broad-based slightly ataxic gait.  She  has some apraxia, particularly when she begins to walk and when she  makes it down.   LABORATORY DATA:  MRI scan of the brain done today reveals no acute  abnormality.  Nonspecific white matter hyperintensities consistent with  small vessel disease and mild __________is noted.  MRI scan of the  lumbar spine shows multilevel spondylitic changes, without any definite  severe spinal stenosis or encroachment.  These area stable compared with  CT scan from November 2009.  MRA of the brain shows no significant  stenosis.   Basic metabolic panel labs are unremarkable.  UA is negative for  infection.   IMPRESSION:  A 70 year old lady with advanced cortical dementia with new  onset of gait difficulties of unclear etiology.   PLAN:  I would recommend getting her previous neurological workup  records from my point.  Check MRI scan of cervical and thoracic spine to  rule out significant compressive myelopathy given her brisk reflexes.   Physical therapy consult for gait and balance training.  Continue  Aricept for now.   I will be happy to follow the patient during hospitalization.  Subsequently she can follow up to a neurologist in [facility] and call  for questions.           ______________________________  Sunny Schlein. Pearlean Brownie, MD     PPS/MEDQ  D:  07/07/2009  T:  07/07/2009  Job:  [id number]

## 2011-04-05 NOTE — Op Note (Signed)
[facility]  Patient:    Sally Zimmerman, Sally Zimmerman                     MRN: [id number] Proc. Date: 04/17/00 Adm. Date:  [id number] Disc. Date: [id number] Attending:  Morene Antu                           Operative Report  PREOPERATIVE DIAGNOSIS:       Cystocele, vaginal prolapse.  POSTOPERATIVE DIAGNOSIS:      Cystocele, vaginal prolapse.  OPERATION:                    Laparoscopic-assisted vaginal hysterectomy, bilateral salpingo-oophorectomy, and anterior repair.  SURGEONS:                     Dr. Rosalio Macadamia and Dr. Estanislado Pandy.  ANESTHESIA:                   General.  INDICATIONS:                  This is a 70 year old G2, P2-0-0-2 woman, who has been complaining of increasing vaginal pressure and discomfort.  The patient feels that the vaginal tissues are dropping out of the vagina.  The patient also complains of difficulty urinating.  Because of this, the patient is requesting surgical intervention.  The patient requests removal of ovaries at the time of surgery.  FINDINGS:                     Normal-sized anteflexed uterus.  Normal atrophic ovaries.  Normal fallopian tubes.  Third degree cystocele.  DESCRIPTION OF PROCEDURE:     The patient was brought into the operating room and given adequate general anesthesia.  She was placed in a dorsal lithotomy position, her abdomen and vagina were washed with Betadine.  A Foley catheter was inserted in the bladder.  The patient was draped in a sterile fashion.  A subumbilical incision was made and Veress needle was introduced into the peritoneal cavity and placement was checked with saline, approximately 3 L of carbon dioxide was insufflated.  The Veress needle was removed and laparoscopic trocar was easily introduced into the peritoneal cavity. Positive identification of pelvic organs were made.  Left lateral mid incision was made and under direct visualization the trocar was placed.  A similar trocar  was placed on the right side.  There was initially some bleeding through this trocar site.  However, the bleeding stopped over the course of the procedure.  Using a tripolar instrument, the right round ligament was cauterized and cut.  The right infundibulopelvic ligament was cauterized and cut in successive bites.  This same procedure was performed on the left round ligament and left infundibulopelvic ligaments.  The ureter was identified easily on the right prior to this procedure.  The ureter was very difficulty to identify on the left but was clearly well below the surgical position.  The anterior ligament at the broad ligament was incised and cut across the bladder flap.  The bladder was advanced down off of the cervix with gentle pressure. The cautery on both side of the uterus was taken to the uterus.  Adequate hemostasis was present.  The vaginal portion of the procedure was then performed.  Weighted speculum was placed within the vagina.  The cervix was grasped with a Jacobson tenaculum.  The cervix was infiltrated  with 1% Xylocaine with epinephrine.  Circumferentially the cervix was circumcised and the vaginal tissue was dissected off of the cervix with blunt dissection.  The posterior peritoneum was identified and cul-de-sac was opened.  Uterosacral ligaments were clamped, cut and suture ligated with 0 Vicryl ligatures.  This was performed x 2 on each side.  The bladder was dissected off of the cervix with blunt dissection and the peritoneal cavity was identified, and on alternating side, the cardinal ligaments were clamped, cut and suture ligated until all attachments to the uterus were free.  The uterus was removed in this fashion. There was no significant bleeding.  The vaginal cuff was closed with 0 Vicryl in a running lock stitch.  The uterosacral plication stitch was taken with 0 Vicryl to prevent further enterocele.  Attention was then turned to the anterior  repair.  Vaginal edges were clamped with Allis clamps.  The vagina was dissected off the bladder with blunt and sharp dissection up to just below the urethra.  The bladder tissues were then dissected off of the vaginal tissues with blunt and sharp dissection.  Once this was performed adequately, the Kelly plication stitch for urethral support was taken with 0 Vicryl and a mattress type stitch.  The cystocele was then reduced by using 3-0 Vicryl and plication stitches across the endopelvic fascia.  Once the bladder was supported well in this fashion, the excess vaginal tissue was excised.  The uterosacral ligaments were then tied together.  The vaginal cuff was closed at the apex using 0 Vicryl in figure-of-eight stitches.  Once the vaginal tissues of the cystocele repair were met, the vaginal tissues were then closed with 3-0 chromic in a running lock stitch.  Adequate hemostasis was present.  No packing was felt to be necessary.  The cystocele repair was felt to be very small and therefore felt that a suprapubic catheter was not necessary.  All instruments were taken out of the vagina.  The surgeons ______ gloves were changed and the laparoscope was reintroduced into the peritoneal cavity and insufflation of CO2 was performed.  The pelvis was inspected.  All vascular sites were visualized and pictures taken. Adequate hemostasis was present.  The pelvis was irrigated well.  No further abnormalities were found.  All the carbon dioxide was allowed to escape.  The laparoscope was removed under direct visualization.  There was no bleeding from the trocar sites prior to removal of the laparoscope.  The subumbilical incision was then closed deeply with 0 Vicryl.  Incisions were infiltrated with 0.25% Marcaine.  The skin incisions were closed with 4-0 Maxzide in subcuticular interrupted stitches.  Steri-Strips were placed across the incisions.  Bandage placed across the wounds.  The patient was  taken out of the dorsal lithotomy position.  She was removed from the operating table to the stretcher in stable condition.  COMPLICATIONS:                None.  ESTIMATED BLOOD LOSS:         100 cc.  DD:  04/17/00 TD:  04/21/00 Job: [id number]

## 2011-04-05 NOTE — Op Note (Signed)
[place]  Patient:    Sally Zimmerman, Sally Zimmerman Visit Number: [id number] MRN: [id number]          Service Type: DSU Location: [facility] Attending Physician:  Marcene Corning Dictated by:   Lubertha Basque. Jerl Santos, M.D. Proc. Date: 05/11/02 Admit Date:  05/11/2002                             Operative Report  PREOPERATIVE DIAGNOSIS:  Right wrist scapholunate advance collapse.  POSTOPERATIVE DIAGNOSIS:  Right wrist scapholunate advance collapse.  OPERATION PERFORMED:  Right wrist fusion.  ANESTHESIA:  General  ATTENDING SURGEON:  Lubertha Basque. Jerl Santos, M.D.  ASSISTANT:  Lindwood Qua, P.A.  INDICATIONS FOR PROCEDURE:  The patient is a 70 year old woman with a long history of right wrist pain.  This has persisted despite bracing and injection and oral anti-inflammatories.  She is limited in using her hand at this point and is offered proximal row carpectomy versus fusion of the wrist.  Our plan was to perform a proximal row carpectomy assuming she had no significant degenerative changes in the lunate fossa.  The procedure was discussed with the patient and informed operative consent was obtained after discussion of possible complications of reaction to anesthesia, infection and neurovascular injury.  DESCRIPTION OF PROCEDURE:  The patient was taken to an operating suite where general anesthetic was applied without difficulty.  She was then positioned supine and prepped and draped in normal sterile fashion.  After administration of preop intravenous antibiotics, the right arm was elevated, exsanguinated, and a tourniquet inflated about the upper arm.  A longitudinal incision was made on the dorsal aspect of the wrist with dissection down to the proximal row and the distal radius.  The EPL tendon was taken out of its bed in a radial direction.  The wrist was then examined.  She was found to have scapholunate advanced collapse of the capitate coming down  towards the radius through the proximal row.  The head of the capitate appeared a bit degenerative but acceptable.  Unfortunately, the lunate fossa was devoid or articular cartilage.  This was fairly surprising considering her x-rays did not look quite that bad but it was felt that an ERC would be doomed to failure.  As a result, we decided to perform a wrist fusion so as to put this patient through just one operation.  Residual cartilage was removed from the proximal and middle rows.  Some proud portions of the bones were burred down with a bur.  A Synthes wrist fusion plate was then placed on the distal radius and across the wrist to the third metacarpal.  This was placed in about 30 degrees of wrist extension.  Three screws were placed in the distal radius with three in the metacarpal and one in the carpus.  Some excess bone chips from preparation were placed into the wrist and some Depuy synthetic bone graft was mixed and placed into the void as well.  Fluoroscopy was used to confirm placement of the plate and adequate position of the wrist and I read all these views myself.  The tourniquet was deflated and the fingers became pink and warm immediately.  The wound was irrigated followed by reapproximation of subcutaneous tissues with 2-0 undyed Vicryl and the skin with nylon.  Adaptic was placed on the wound followed by dry gauze and a loose Ace wrap.  Estimated blood loss and intraoperative fluids can be obtained from  Anesthesia records as can accurate tourniquet time.  DISPOSITION:  The patient was extubated in the operating room and taken to the recovery room in stable condition.  Plans were for her to go home the same day and to follow up in the office in less than a week.  I will contact her by phone tonight. Dictated by:   Lubertha Basque Jerl Santos, M.D. Attending Physician:  Marcene Corning DD:  05/11/02 TD:  05/12/02 Job: [id number]

## 2011-04-05 NOTE — Procedures (Signed)
[facility]  Patient:    Sally Zimmerman, Sally Zimmerman                     MRN: [id number] Proc. Date: 07/17/00 Adm. Date:  [id number] Attending:  Nelda Marseille CC:         Anna Genre. Little, M.D.  Sherry A. Rosalio Macadamia, M.D.   Procedure Report  PROCEDURE:  Colonoscopy with biopsy.  SURGEON:  Petra Kuba, M.D.  INDICATIONS:  Bright red blood per rectum, left lower quadrant pain.  Consent was signed after risks, benefits, methods, and options were thoroughly discussed in the past.  MEDICINES USED:  Demerol 80 and Versed 8.  DESCRIPTION OF PROCEDURE:  Rectal inspection was pertinent for small external hemorrhoids.  Digital exam was negative.  The video pediatric colonoscope was inserted.  There was some looping and tortuosity in the sigmoid, but once we were in the sigmoid, we easily advanced to the cecum.  On insertion, some left-sided diverticuli were seen, but no other abnormalities.  The cecum was identified by the appendiceal orifice and the ileocecal valve.  In fact, the scope was inserted a short ways into the terminal ileum which was normal. Photo documentation was obtained.  The scope was slowly withdrawn.  The cecum, ascending, transverse, and the majority of the descending were normal.  In the left side of the colon, there was some diverticuli.  The sigmoid was tortuous and there was an occasional edematous fold, a tiny erosion, and a minimal amount of inflammation, patchy.  Some scattered biopsies were obtained.  In the distal sigmoid, two tiny hyperplastic-appearing polyps were seen which were each cold biopsied x 1.  The scope was further withdrawn back to the rectum and retroflexed and pertinent from some internal hemorrhoids.  The scope was straightened.  Air was withdrawn and the scope removed.  The patient tolerated the procedure well.  There was no obvious immediate complication.  ENDOSCOPIC DIAGNOSES: 1. Internal and external  hemorrhoids, small. 2. Two tiny superior and distal sigmoid polyps, status post cold biopsy. 3. Left diverticuli tortuosity with minimal inflammation wall edema, one    erosion seen, status post biopsy. 4. Otherwise within normal limits to the terminal ileum with adequate    preparation, not mentioned above.  PLAN:  Await pathology to determine future colonic screening or any further workup.  Happy to see back p.r.n. or in six weeks to recheck symptoms, probably guaiacs, maybe labs, and consider a CT scan next. DD:  07/17/00 TD:  07/18/00 Job: [id number]

## 2011-04-05 NOTE — Op Note (Signed)
Sally Zimmerman, Sally Zimmerman              ACCOUNT NO.:  [id number]   MEDICAL RECORD NO.:  [id number]          PATIENT TYPE:  AMB   LOCATION:  ENDO                         FACILITY:  [facility]   PHYSICIAN:  Petra Kuba, M.D.    DATE OF BIRTH:  [birth year]   DATE OF PROCEDURE:  07/08/2006  DATE OF DISCHARGE:                                 OPERATIVE REPORT   PROCEDURE:  Esophagogastroduodenoscopy with biopsy.   The patient with atypical reflux symptoms.  Want to rule out reflux.  Consent was signed after risks, benefits and options thoroughly discussed in  the office.   MEDICINES USED:  Fentanyl 75 mcg and Versed 7 mg.   PROCEDURE:  Video endoscope was inserted by direct vision.  The esophagus  was normal. Quick look at the posterior pharynx was normal although not well  evaluated.  She did have a tiny hiatal hernia.  Scope passed into the  stomach and advanced to the antrum where a few tiny antral erosions were  seen, then through a normal pylorus into the duodenal bulb where some  minimal bulbitis was seen and around the celiac to a normal second portion  of the duodenum.  The scope was withdrawn back to the bulb and a good look  there ruled out ulcers in that location.  Scope was withdrawn back into the  stomach in retroflex.  Angularis, cardia, fundus, lesser and greater curve  were all normal on retroflex visualization.  Straight visualization of the  stomach did not reveal any additional findings.  The scope was then slowly  withdrawn back to about 15 cm.  Again, confirming the normal esophagus.  Scope was readvanced into the stomach.  A few biopsies of the antrum and a  few of the proximal stomach were obtained to rule out Helicobacter and any  other microscopic abnormality.  The scope was then slowly withdrawn.  Again  a good look at the esophagus was normal.  We did take a few mid and proximal  esophageal biopsies to rule out any microscopic abnormality.  Scope was  removed.  The  patient tolerated the procedure well.  There was no obvious or  immediate complications.   DIAGNOSES:  1. Tiny hiatal hernia status post esophageal biopsy.  2. Antral few erosions status post biopsy and proximal gastric biopsy.  3. Minimal bulbitis.  4. Otherwise normal esophagogastroduodenoscopy.   PLAN:  1. Continue b.i.d. pump inhibitors.  2. Await pathology.  3. Probably proceed with a one time Ear, Nose and Throat consult next.          ______________________________  Petra Kuba, M.D.    MEM/MEDQ  D:  07/08/2006  T:  07/08/2006  Job:  [id number]   cc:   Caryn Bee L. Little, M.D.

## 2011-04-05 NOTE — Op Note (Signed)
[place]  Patient:    Sally Zimmerman, Sally Zimmerman                     MRN: [id number] Proc. Date: 12/25/99 Adm. Date:  [id number] Disc. Date: [id number] Attending:  Marcene Corning                           Operative Report  PREOPERATIVE DIAGNOSIS:  Displaced left radial head fracture.  POSTOPERATIVE DIAGNOSIS:  Displaced left radial head fracture.  OPERATION PERFORMED:  Left radial head open reduction internal fixation.  ANESTHESIA:  Axillary block.  ATTENDING SURGEON:  Lubertha Basque. Jerl Santos, M.D.  ASSISTANT:  Lindwood Qua, P.A.  INDICATIONS FOR PROCEDURE:  The patient is a 70 year old woman who fell on a tennis court a couple of days ago and sustained a displaced radial head fracture. This involved about 40% of her radial head and was displaced several millimeters. Planned procedure was for ORIF. The procedure was discussed with the patient and informed operative consent was obtained after discussion of possible complications of reaction to anesthesia, infection, elbow stiffness and need for further surgery.  DESCRIPTION OF PROCEDURE:  The patient was taken to an operating suite where axillary block was applied without difficulty.  She was then positioned supine nd prepped and draped in normal sterile fashion.  After administration of preop IV  antibiotics, the left arm was elevated, exsanguinated and a tourniquet inflated  about the upper arm.  A lateral approach was taken to the radial capitellar joint. Care was taken not to dissect down to the radial neck so as not to interfere with the posterior interosseous nerve.  No retractors were placed around the neck of the radius.  The fracture was easily exposed.  This was depressed about 2 mm or more in the central portion.  There was some comminution on the rim of the radial head nd one small piece that was loose in the joint and was removed.  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  No exposed bone was seen.  The joint was thoroughly irrigated and one small loose piece was removed.  The central portion of the radial head was elevated back to  proper position and this was then secured with two screws from the minifragment set which were 2.0 mm fully threaded cortical screws.  Fluoroscopy was used to confirm adequate placement and reduction of the fracture.  The radial head was visualized fluoroscopically and I read these views myself.  She had excellent motion and once the fracture had been reduced, she actually came to full extension, which she did not at the beginning of the case even with the axillary block in place.  The wound was thoroughly irrigated followed by release of the tourniquet.  The hand became pink and warm immediately.  The fascial structures were reapproximated with 2-0  undyed Vicryl followed by reapproximation of the subcutaneous tissues with the ame suture in interrupted fashion.  Skin was closed with nylon followed by Adaptic nd a dry gauze dressing.  Her elbow was placed in a posterior splint in neutral position.  Estimated blood loss and intraoperative fluids can be obtained from Anesthesia records as can accurate tourniquet time.  DISPOSITION:  The patient was taken to the recovery room in stable condition. Plans were for her to go home the same day and to follow up in the office  in less than a week.  I will contact her by phone tonight. DD:  12/25/99 TD:  12/25/99 Job: [id number]

## 2011-07-23 ENCOUNTER — Ambulatory Visit: Payer: Self-pay | Admitting: Physical Therapy

## 2011-07-30 ENCOUNTER — Ambulatory Visit: Payer: Medicare Other | Attending: Family Medicine | Admitting: Physical Therapy

## 2011-07-30 DIAGNOSIS — R269 Unspecified abnormalities of gait and mobility: Secondary | ICD-10-CM | POA: Insufficient documentation

## 2011-07-30 DIAGNOSIS — IMO0001 Reserved for inherently not codable concepts without codable children: Secondary | ICD-10-CM | POA: Insufficient documentation

## 2011-08-21 LAB — DIFFERENTIAL
Basophils Absolute: 0.1
Basophils Relative: 0
Eosinophils Absolute: 0
Eosinophils Relative: 0
Lymphocytes Relative: 14
Lymphs Abs: 1.7
Monocytes Absolute: 0.8
Monocytes Relative: 7
Neutro Abs: 9.9 — ABNORMAL HIGH
Neutrophils Relative %: 79 — ABNORMAL HIGH

## 2011-08-21 LAB — POCT I-STAT, CHEM 8
BUN: 12
Calcium, Ion: 1.3
Chloride: 101
Creatinine, Ser: 1.2
Glucose, Bld: 91
HCT: 47 — ABNORMAL HIGH
Hemoglobin: 16 — ABNORMAL HIGH
Potassium: 4.1
Sodium: 140
TCO2: 34

## 2011-08-21 LAB — COMPREHENSIVE METABOLIC PANEL
AST: 24
AST: 26
BUN: 9
CO2: 26
CO2: 28
Calcium: 9.1
Calcium: 9.3
Chloride: 102
Creatinine, Ser: 0.93
Creatinine, Ser: 0.98
GFR calc Af Amer: >60
GFR calc Af Amer: >60
GFR calc non Af Amer: 57 — ABNORMAL LOW
GFR calc non Af Amer: >60
Glucose, Bld: 124 — ABNORMAL HIGH
Total Bilirubin: 1.6 — ABNORMAL HIGH

## 2011-08-21 LAB — URINALYSIS, ROUTINE W REFLEX MICROSCOPIC
Bilirubin Urine: NEGATIVE
Glucose, UA: NEGATIVE
Hgb urine dipstick: NEGATIVE
Ketones, ur: NEGATIVE
Nitrite: NEGATIVE
Protein, ur: NEGATIVE
Specific Gravity, Urine: 1.008
Urobilinogen, UA: 0.2
pH: 7

## 2011-08-21 LAB — CBC
HCT: 46.2 — ABNORMAL HIGH
Hemoglobin: 13.4
Hemoglobin: 15.3 — ABNORMAL HIGH
MCHC: 33.1
MCHC: 33.2
MCHC: 33.6
MCV: 90.4
MCV: 90.5
Platelets: 239
RBC: 4.65
RBC: 5.11
RDW: 13.6
RDW: 13.6
WBC: 12.5 — ABNORMAL HIGH

## 2011-08-21 LAB — URINE CULTURE

## 2011-08-21 LAB — CULTURE, BLOOD (ROUTINE X 2)
Culture: NO GROWTH
Culture: NO GROWTH

## 2011-08-21 LAB — LACTIC ACID, PLASMA: Lactic Acid, Venous: 1.1

## 2011-08-21 LAB — LIPASE, BLOOD
Lipase: 38
Lipase: 81 — ABNORMAL HIGH

## 2011-08-21 LAB — URINE MICROSCOPIC-ADD ON

## 2011-08-21 LAB — POCT CARDIAC MARKERS
CKMB, poc: <1 — ABNORMAL LOW
Myoglobin, poc: 81.9
Troponin i, poc: <0.05

## 2011-08-21 LAB — CARDIAC PANEL(CRET KIN+CKTOT+MB+TROPI): CK, MB: 2

## 2011-08-21 LAB — CK TOTAL AND CKMB (NOT AT ARMC): Relative Index: INVALID

## 2011-08-21 LAB — LIPID PANEL
Cholesterol: 132
LDL Cholesterol: 73
Triglycerides: 115

## 2011-08-21 LAB — HEMOGLOBIN A1C: Hgb A1c MFr Bld: 6.8 — ABNORMAL HIGH

## 2014-02-14 ENCOUNTER — Emergency Department (HOSPITAL_COMMUNITY): Payer: Medicare Other

## 2014-02-14 ENCOUNTER — Encounter (HOSPITAL_COMMUNITY): Payer: Self-pay | Admitting: Emergency Medicine

## 2014-02-14 ENCOUNTER — Emergency Department (HOSPITAL_COMMUNITY)
Admission: EM | Admit: 2014-02-14 | Discharge: 2014-02-14 | Disposition: A | Discharge status: Home / Self Care | Payer: Medicare Other | Attending: Emergency Medicine | Admitting: Emergency Medicine

## 2014-02-14 DIAGNOSIS — Z87448 Personal history of other diseases of urinary system: Secondary | ICD-10-CM | POA: Insufficient documentation

## 2014-02-14 DIAGNOSIS — Z79899 Other long term (current) drug therapy: Secondary | ICD-10-CM | POA: Insufficient documentation

## 2014-02-14 DIAGNOSIS — I69959 Hemiplegia and hemiparesis following unspecified cerebrovascular disease affecting unspecified side: Secondary | ICD-10-CM | POA: Insufficient documentation

## 2014-02-14 DIAGNOSIS — F0391 Unspecified dementia with behavioral disturbance: Secondary | ICD-10-CM | POA: Insufficient documentation

## 2014-02-14 DIAGNOSIS — I10 Essential (primary) hypertension: Secondary | ICD-10-CM | POA: Insufficient documentation

## 2014-02-14 DIAGNOSIS — R569 Unspecified convulsions: Secondary | ICD-10-CM

## 2014-02-14 DIAGNOSIS — F03918 Unspecified dementia, unspecified severity, with other behavioral disturbance: Secondary | ICD-10-CM | POA: Insufficient documentation

## 2014-02-14 HISTORY — DX: Essential (primary) hypertension: I10

## 2014-02-14 HISTORY — DX: Unspecified dementia, unspecified severity, without behavioral disturbance, psychotic disturbance, mood disturbance, and anxiety: F03.90

## 2014-02-14 HISTORY — DX: Restlessness and agitation: R45.1

## 2014-02-14 HISTORY — DX: Hemiplegia and hemiparesis following cerebral infarction affecting unspecified side: I69.359

## 2014-02-14 HISTORY — DX: Disorder of kidney and ureter, unspecified: N28.9

## 2014-02-14 LAB — VALPROIC ACID LEVEL: Valproic Acid Lvl: 29.7 ug/mL — ABNORMAL LOW (ref 50.0–100.0)

## 2014-02-14 LAB — URINALYSIS, ROUTINE W REFLEX MICROSCOPIC
Bilirubin Urine: NEGATIVE
Glucose, UA: NEGATIVE mg/dL
Hgb urine dipstick: NEGATIVE
Ketones, ur: NEGATIVE mg/dL
Leukocytes, UA: NEGATIVE
Nitrite: NEGATIVE
Protein, ur: NEGATIVE mg/dL
Specific Gravity, Urine: 1.019 (ref 1.005–1.030)
Urobilinogen, UA: 0.2 mg/dL (ref 0.0–1.0)
pH: 5 (ref 5.0–8.0)

## 2014-02-14 LAB — CBC WITH DIFFERENTIAL/PLATELET
Basophils Absolute: 0 K/uL (ref 0.0–0.1)
Basophils Relative: 0 % (ref 0–1)
Eosinophils Absolute: 0.1 K/uL (ref 0.0–0.7)
Eosinophils Relative: 2 % (ref 0–5)
HCT: 46 % (ref 36.0–46.0)
Hemoglobin: 15.4 g/dL — ABNORMAL HIGH (ref 12.0–15.0)
Lymphocytes Relative: 35 % (ref 12–46)
Lymphs Abs: 2.3 10*3/uL (ref 0.7–4.0)
MCH: 31.5 pg (ref 26.0–34.0)
MCHC: 33.5 g/dL (ref 30.0–36.0)
MCV: 94.1 fL (ref 78.0–100.0)
Monocytes Absolute: 0.4 K/uL (ref 0.1–1.0)
Monocytes Relative: 6 % (ref 3–12)
Neutro Abs: 3.7 10*3/uL (ref 1.7–7.7)
Neutrophils Relative %: 57 % (ref 43–77)
Platelets: 158 K/uL (ref 150–400)
RBC: 4.89 MIL/uL (ref 3.87–5.11)
RDW: 12.8 % (ref 11.5–15.5)
WBC: 6.6 10*3/uL (ref 4.0–10.5)

## 2014-02-14 LAB — APTT: aPTT: 29 s (ref 24–37)

## 2014-02-14 LAB — BASIC METABOLIC PANEL
BUN: 22 mg/dL (ref 6–23)
Chloride: 102 mEq/L (ref 96–112)
GFR calc Af Amer: 70 mL/min — ABNORMAL LOW (ref >90)
Glucose, Bld: 93 mg/dL (ref 70–99)
Potassium: 4.4 mEq/L (ref 3.7–5.3)
Sodium: 142 mEq/L (ref 137–147)

## 2014-02-14 LAB — BASIC METABOLIC PANEL WITH GFR
CO2: 25 meq/L (ref 19–32)
Calcium: 9.9 mg/dL (ref 8.4–10.5)
Creatinine, Ser: 0.93 mg/dL (ref 0.50–1.10)
GFR calc non Af Amer: 60 mL/min — ABNORMAL LOW (ref >90)

## 2014-02-14 LAB — CBG MONITORING, ED: Glucose-Capillary: 92 mg/dL (ref 70–99)

## 2014-02-14 LAB — PROTIME-INR
INR: 1 (ref 0.00–1.49)
Prothrombin Time: 13 seconds (ref 11.6–15.2)

## 2014-02-14 LAB — LACTIC ACID, PLASMA: Lactic Acid, Venous: 4.2 mmol/L — ABNORMAL HIGH (ref 0.5–2.2)

## 2014-02-14 MED ORDER — DIVALPROEX SODIUM 125 MG PO CPSP: 375.0000 mg | ORAL_CAPSULE | Freq: Two times a day (BID) | ORAL | Status: AC

## 2014-02-14 MED ORDER — VALPROATE SODIUM 500 MG/5ML IV SOLN
250.0000 mg | Freq: Once | INTRAVENOUS | Status: AC
  Administered 2014-02-14: 250 mg via INTRAVENOUS
  Filled 2014-02-14: qty 2.5

## 2014-02-14 NOTE — ED Notes (Signed)
[facility],  Found patient unresponsive to painful stimuli.  Staff states they found patient unresponsive and called 911.

## 2014-02-14 NOTE — ED Notes (Signed)
Spoke with [facility], regarding pt's status.

## 2014-02-14 NOTE — ED Provider Notes (Signed)
Signed out by Dr Oletta LamasGhim at 0730 that pt w hx dementia, prior cva, psychomotor agitation, and that presented w suspected initial seizure, and that patient now back to baseline.  Dr Oletta LamasGhim indicates he has consulted w neurology, and that their recommendation is to check depakote level, is sub therapeutic, give dose depakote in ED, and d/c to ecf on depakote to follow up as outpt.   depakote level 27. depakote 250 mg iv. Recheck remains alert, content, afeb, mental status c/w baseline, no seizure activity. Will d/c to e[facility] per above plan to follow up with neurology as outpt.     Suzi RootsKevin E Billi Bright, MD 02/14/14 [phone]

## 2014-02-14 NOTE — Discharge Instructions (Signed)
We discussed your case with our neurologist - increase your depakote dose to 375 mg 2x/day, and follow up with neurologist in the next 1-2 weeks - call your neurologist, or see referral if you dont currently have a neurologist, to arrange follow up with them. Return to ER if worse, new symptoms, recurrent seizures, fevers, other concern.     Seizure, Adult A seizure is abnormal electrical activity in the brain. Seizures usually last from 30 seconds to 2 minutes. There are various types of seizures. Before a seizure, you may have a warning sensation (aura) that a seizure is about to occur. An aura may include the following symptoms:   Fear or anxiety.  Nausea.  Feeling like the room is spinning (vertigo).  Vision changes, such as seeing flashing lights or spots. Common symptoms during a seizure include:  A change in attention or behavior (altered mental status).  Convulsions with rhythmic jerking movements.  Drooling.  Rapid eye movements.  Grunting.  Loss of bladder and bowel control.  Bitter taste in the mouth.  Tongue biting. After a seizure, you may feel confused and sleepy. You may also have an injury resulting from convulsions during the seizure. HOME CARE INSTRUCTIONS   If you are given medicines, take them exactly as prescribed by your health care provider.  Keep all follow-up appointments as directed by your health care provider.  Do not swim or drive or engage in risky activity during which a seizure could cause further injury to you or others until your health care provider says it is OK.  Get adequate rest.  Teach friends and family what to do if you have a seizure. They should:  Lay you on the ground to prevent a fall.  Put a cushion under your head.  Loosen any tight clothing around your neck.  Turn you on your side. If vomiting occurs, this helps keep your airway clear.  Stay with you until you recover.  Know whether or not you need emergency  care. SEEK IMMEDIATE MEDICAL CARE IF:  The seizure lasts longer than 5 minutes.  The seizure is severe or you do not wake up immediately after the seizure.  You have an altered mental status after the seizure.  You are having more frequent or worsening seizures. Someone should drive you to the emergency department or call local emergency services (911 in [place] MAKE SURE YOU:  Understand these instructions.  Will watch your condition.  Will get help right away if you are not doing well or get worse. Document Released: 11/01/2000 Document Revised: 08/25/2013 Document Reviewed: 06/16/2013 [facility] Patient Information 2014 [place], [place].

## 2014-02-14 NOTE — ED Notes (Signed)
[facility]

## 2014-02-14 NOTE — ED Provider Notes (Signed)
CSN: [id number]     Arrival date & time 02/14/14  0505 History   First MD Initiated Contact with Patient 02/14/14 [phone]     Chief Complaint  Patient presents with  . Seizures     (Consider location/radiation/quality/duration/timing/severity/associated sxs/prior Treatment) Patient is a 73 y.o. female presenting with seizures. The history is provided by the EMS personnel. The history is limited by the condition of the patient.  Seizures Seizure activity on arrival: yes   Seizure type:  Grand mal and tonic Episode characteristics: stiffening   Postictal symptoms: somnolence   Return to baseline: no   PTA treatment:  Midazolam History of seizures: no     Past Medical History  Diagnosis Date  . Dementia   . Psychomotor agitation   . 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 Hypertension   . Renal disorder    History reviewed. No pertinent past surgical history. No family history on file. History  Substance Use Topics  . Smoking status: Never Smoker   . Smokeless tobacco: Not on file  . Alcohol Use: No   OB History   Grav Para Term Preterm Abortions TAB SAB Ect Mult Living                 Review of Systems  Unable to perform ROS: Mental status change  Neurological: Positive for seizures.      Allergies  Review of patient's allergies indicates no known allergies.  Home Medications   Current Outpatient Rx  Name  Route  Sig  Dispense  Refill  . acetaminophen (TYLENOL) 500 MG tablet   Oral   Take 1,000 mg by mouth 3 (three) times daily.         . clonazePAM (KLONOPIN) 0.5 MG tablet   Oral   Take 0.5 mg by mouth 2 (two) times daily as needed for anxiety (agitation).         . Corn Starch POWD   Topical   Apply 1 application topically 4 (four) times daily as needed (apply to infected areas as needed).         . divalproex (DEPAKOTE SPRINKLE) 125 MG capsule   Oral   Take 250 mg by mouth 2 (two) times daily.         Marland Kitchen. donepezil (ARICEPT) 10 MG  tablet   Oral   Take 10 mg by mouth at bedtime.         . feeding supplement (ENSURE IMMUNE HEALTH) LIQD   Oral   Take 237 mLs by mouth 3 (three) times daily with meals.         . methimazole (TAPAZOLE) 5 MG tablet   Oral   Take 5 mg by mouth daily.         . sertraline (ZOLOFT) 25 MG tablet   Oral   Take 25 mg by mouth at bedtime.          BP 152/78  Pulse 88  Temp(Src) 97.2 F (36.2 C) (Rectal)  Resp 20  SpO2 95% Physical Exam  Nursing note and vitals reviewed. Constitutional: She appears well-developed and well-nourished. She appears listless. No distress.  HENT:  Head: Normocephalic and atraumatic.  Gag reflex intact  Eyes: Pupils are equal, round, and reactive to light.  Neck: Neck supple.  Pulmonary/Chest: Effort normal. No respiratory distress.  Abdominal: Soft. She exhibits no distension. There is no tenderness.  Neurological: She appears listless. She exhibits normal muscle tone. GCS eye subscore is  1. GCS verbal subscore is 2. GCS motor subscore is 4.  Pt prefers to move left side over right with withdrawing  Skin: Skin is warm.    ED Course  Procedures (including critical care time) Labs Review Labs Reviewed  CBC WITH DIFFERENTIAL - Abnormal; Notable for the following:    Hemoglobin 15.4 (*)    All other components within normal limits  BASIC METABOLIC PANEL - Abnormal; Notable for the following:    GFR calc non Af Amer 60 (*)    GFR calc Af Amer 70 (*)    All other components within normal limits  LACTIC ACID, PLASMA - Abnormal; Notable for the following:    Lactic Acid, Venous 4.2 (*)    All other components within normal limits  URINALYSIS, ROUTINE W REFLEX MICROSCOPIC - Abnormal; Notable for the following:    APPearance CLOUDY (*)    All other components within normal limits  APTT  PROTIME-INR  VALPROIC ACID LEVEL  CBG MONITORING, ED   Imaging Review Ct Head Wo Contrast  02/14/2014   CLINICAL DATA:  Seizures.  EXAM: CT HEAD WITHOUT  CONTRAST  TECHNIQUE: Contiguous axial images were obtained from the base of the skull through the vertex without intravenous contrast.  COMPARISON:  07/06/2009  FINDINGS: Diagnostic sensitivity is degraded by patient motion, a limiting factor despite multiple image acquisition.  Skull and Sinuses:No acute osseous findings. There is a 13 mm mass in the superficial parotid, not significantly enlarged from 2010.  Orbits: No acute abnormality.  Brain: No evidence of acute abnormality, such as acute infarction, hemorrhage, hydrocephalus, or mass lesion/mass effect. There is chronic small vessel disease with white matter low-attenuation confluent around the lateral ventricles. Progressive brain atrophy from 2010, with ex vacuo ventricular enlargement. The atrophy has a predilection for the biparietal lobes.  IMPRESSION: 1. As permitted by extensive motion artifact, no evidence of acute intracranial disease. 2. Brain atrophy which is progressive from 2010. There is a parietal predilection favoring Alzheimer's disease. 3. 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   Electronically Signed   By: Tiburcio Pea M.D.   On: 02/14/2014 06:27   Dg Chest Port 1 View  02/14/2014   CLINICAL DATA:  Unresponsive and combative, seizure.  EXAM: PORTABLE CHEST - 1 VIEW  COMPARISON:  DG CHEST 1V dated 02/04/2011  FINDINGS: Cardiomediastinal silhouette is nonsuspicious for this low inspiratory portable examination with crowded vasculature markings. Mildly calcified aortic knob. Stable chronic moderate interstitial prominence. The lungs are otherwise clear without pleural effusions or focal consolidations. Trachea projects midline and there is no pneumothorax. Included soft tissue planes and osseous structures are non-suspicious.  IMPRESSION: Stable chronic interstitial changes without superimposed acute cardiopulmonary process.   Electronically Signed   By: Awilda Metro   On: 02/14/2014 06:28     EKG  Interpretation SR at rate 65 with occasional PVC. Otherwise nml ECG.       O2 sat on [place] O2 is 100% and I interpret to be adequate  6:48 AM Pt is now much more awake, alert.  Head CT shows no gross acute abn.  Lactic acid is elevated which suggests pt indeed had a seizure.  Will discuss with neuro hospitalist regarding starting antiepileptics.     7:10 AM Discussed with Dr. Cyril Mourning who suggests making sure that she is therapeutic on depakote level.  No other interventions.  Since pt is back to baseline, no focal deficits, can be discharged back to facility and follow up  with PCP.  Awaiting level currently.        MDM   Final diagnoses:  New onset seizure    Pt with apparently new onset seizure.  Pt with h/o dementia, hemiplegia.  I question if pt has a h/o stroke which could be a seizure focus.  Will get head CT and monitor closely.  Pt received BZN and likely is post ictal.  Gag reflex intact with GCS 7-8.  Does not appear to be on blood thinners based on MAR.  Pt is presumably full code.      Gavin Pound. Oletta Lamas, MD 02/14/14 [phone]

## 2015-02-17 DEATH — deceased
